# Patient Record
Sex: Male | Born: 1990 | Race: White | Hispanic: No | Marital: Married
Health system: Southern US, Community
[De-identification: ages and names within clinical notes are randomized; demographics above are authoritative.]

---

## 2018-01-09 ENCOUNTER — Encounter (HOSPITAL_COMMUNITY): Payer: Self-pay | Admitting: Radiology

## 2018-01-09 ENCOUNTER — Emergency Department (HOSPITAL_COMMUNITY)
Admission: EM | Admit: 2018-01-09 | Discharge: 2018-01-09 | Disposition: A | Discharge status: Home / Self Care | Payer: BLUE CROSS/BLUE SHIELD | Attending: Emergency Medicine | Admitting: Emergency Medicine

## 2018-01-09 ENCOUNTER — Other Ambulatory Visit: Payer: Self-pay

## 2018-01-09 ENCOUNTER — Emergency Department (HOSPITAL_COMMUNITY): Payer: BLUE CROSS/BLUE SHIELD

## 2018-01-09 DIAGNOSIS — Y999 Unspecified external cause status: Secondary | ICD-10-CM | POA: Insufficient documentation

## 2018-01-09 DIAGNOSIS — Y92410 Unspecified street and highway as the place of occurrence of the external cause: Secondary | ICD-10-CM | POA: Diagnosis not present

## 2018-01-09 DIAGNOSIS — M25562 Pain in left knee: Secondary | ICD-10-CM | POA: Insufficient documentation

## 2018-01-09 DIAGNOSIS — R51 Headache: Secondary | ICD-10-CM | POA: Insufficient documentation

## 2018-01-09 DIAGNOSIS — Z23 Encounter for immunization: Secondary | ICD-10-CM | POA: Diagnosis not present

## 2018-01-09 DIAGNOSIS — S20211A Contusion of right front wall of thorax, initial encounter: Secondary | ICD-10-CM | POA: Diagnosis not present

## 2018-01-09 DIAGNOSIS — S52501A Unspecified fracture of the lower end of right radius, initial encounter for closed fracture: Secondary | ICD-10-CM

## 2018-01-09 DIAGNOSIS — Y939 Activity, unspecified: Secondary | ICD-10-CM | POA: Insufficient documentation

## 2018-01-09 DIAGNOSIS — S20212A Contusion of left front wall of thorax, initial encounter: Secondary | ICD-10-CM | POA: Diagnosis not present

## 2018-01-09 DIAGNOSIS — S161XXA Strain of muscle, fascia and tendon at neck level, initial encounter: Secondary | ICD-10-CM | POA: Insufficient documentation

## 2018-01-09 DIAGNOSIS — S6991XA Unspecified injury of right wrist, hand and finger(s), initial encounter: Secondary | ICD-10-CM | POA: Diagnosis present

## 2018-01-09 LAB — COMPREHENSIVE METABOLIC PANEL
ALT: 61 U/L — ABNORMAL HIGH (ref 0–44)
AST: 89 U/L — AB (ref 15–41)
Albumin: 4.6 g/dL (ref 3.5–5.0)
Alkaline Phosphatase: 54 U/L (ref 38–126)
Anion gap: 12 (ref 5–15)
BUN: 11 mg/dL (ref 6–20)
CHLORIDE: 106 mmol/L (ref 98–111)
CO2: 21 mmol/L — ABNORMAL LOW (ref 22–32)
Calcium: 9.8 mg/dL (ref 8.9–10.3)
Creatinine, Ser: 1 mg/dL (ref 0.61–1.24)
GFR calc Af Amer: >60 mL/min (ref >60)
Glucose, Bld: 115 mg/dL — ABNORMAL HIGH (ref 70–99)
POTASSIUM: 5 mmol/L (ref 3.5–5.1)
Sodium: 139 mmol/L (ref 135–145)
Total Bilirubin: 1.3 mg/dL — ABNORMAL HIGH (ref 0.3–1.2)
Total Protein: 6.8 g/dL (ref 6.5–8.1)

## 2018-01-09 LAB — URINALYSIS, ROUTINE W REFLEX MICROSCOPIC
BILIRUBIN URINE: NEGATIVE
GLUCOSE, UA: NEGATIVE mg/dL
Ketones, ur: NEGATIVE mg/dL
LEUKOCYTES UA: NEGATIVE
Nitrite: NEGATIVE
PH: 5 (ref 5.0–8.0)
PROTEIN: 30 mg/dL — AB
Specific Gravity, Urine: 1.021 (ref 1.005–1.030)

## 2018-01-09 LAB — CBC WITH DIFFERENTIAL/PLATELET
BASOS ABS: 0 10*3/uL (ref 0.0–0.1)
Basophils Relative: 0 %
EOS ABS: 0.1 10*3/uL (ref 0.0–0.7)
Eosinophils Relative: 1 %
HCT: 49.4 % (ref 39.0–52.0)
Hemoglobin: 16.7 g/dL (ref 13.0–17.0)
LYMPHS ABS: 4.6 10*3/uL — AB (ref 0.7–4.0)
Lymphocytes Relative: 33 %
MCH: 29.5 pg (ref 26.0–34.0)
MCHC: 33.8 g/dL (ref 30.0–36.0)
MCV: 87.1 fL (ref 78.0–100.0)
Monocytes Absolute: 0.7 10*3/uL (ref 0.1–1.0)
Monocytes Relative: 5 %
Neutro Abs: 8.6 10*3/uL — ABNORMAL HIGH (ref 1.7–7.7)
Neutrophils Relative %: 61 %
PLATELETS: 188 10*3/uL (ref 150–400)
RBC: 5.67 MIL/uL (ref 4.22–5.81)
RDW: 12.5 % (ref 11.5–15.5)
WBC: 14 10*3/uL — ABNORMAL HIGH (ref 4.0–10.5)

## 2018-01-09 LAB — LIPASE, BLOOD: LIPASE: 33 U/L (ref 11–51)

## 2018-01-09 MED ORDER — SODIUM CHLORIDE 0.9 % IV BOLUS
1000.0000 mL | Freq: Once | INTRAVENOUS | Status: AC
  Administered 2018-01-09: 1000 mL via INTRAVENOUS

## 2018-01-09 MED ORDER — CYCLOBENZAPRINE HCL 10 MG PO TABS: 10.0000 mg | ORAL_TABLET | Freq: Two times a day (BID) | ORAL | 0 refills | Status: AC | PRN

## 2018-01-09 MED ORDER — HYDROCODONE-ACETAMINOPHEN 5-325 MG PO TABS: 1.0000 | ORAL_TABLET | ORAL | 0 refills | Status: AC | PRN

## 2018-01-09 MED ORDER — TETANUS-DIPHTH-ACELL PERTUSSIS 5-2.5-18.5 LF-MCG/0.5 IM SUSP
0.5000 mL | Freq: Once | INTRAMUSCULAR | Status: AC
  Administered 2018-01-09: 0.5 mL via INTRAMUSCULAR
  Filled 2018-01-09: qty 0.5

## 2018-01-09 MED ORDER — IBUPROFEN 600 MG PO TABS: 600.0000 mg | ORAL_TABLET | Freq: Four times a day (QID) | ORAL | 0 refills | Status: AC | PRN

## 2018-01-09 MED ORDER — MORPHINE SULFATE (PF) 4 MG/ML IV SOLN
4.0000 mg | Freq: Once | INTRAVENOUS | Status: AC
  Administered 2018-01-09: 4 mg via INTRAVENOUS
  Filled 2018-01-09: qty 1

## 2018-01-09 MED ORDER — IOHEXOL 300 MG/ML  SOLN
100.0000 mL | Freq: Once | INTRAMUSCULAR | Status: AC | PRN
  Administered 2018-01-09: 100 mL via INTRAVENOUS

## 2018-01-09 MED ORDER — ONDANSETRON HCL 4 MG/2ML IJ SOLN
4.0000 mg | Freq: Once | INTRAMUSCULAR | Status: AC
  Administered 2018-01-09: 4 mg via INTRAVENOUS
  Filled 2018-01-09: qty 2

## 2018-01-09 NOTE — ED Notes (Signed)
Patient transported to X-ray 
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
## 2018-01-09 NOTE — ED Notes (Signed)
Patient transported to CT 

## 2018-01-09 NOTE — ED Notes (Signed)
Ortho tech at bedside 

## 2018-01-09 NOTE — ED Notes (Signed)
Got patient hooked up to the monitor did ekg shown to er doctor patient is resting with call bell in reach 
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
## 2018-01-09 NOTE — ED Notes (Signed)
Pt

## 2018-01-09 NOTE — ED Notes (Signed)
Pt ambulated in hall with no c/o of dizziness or lightheadedness. Pt states only pain is in his left chest/rib area.

## 2018-01-09 NOTE — Progress Notes (Signed)
Orthopedic Tech Progress Note Patient Details:  Eden EmmsMicheal Benett [birth year] [id number]  Ortho Devices Type of Ortho Device: Ace wrap, Volar splint Ortho Device/Splint Location: rue Ortho Device/Splint Interventions: Application   Post Interventions Patient Tolerated: Well Instructions Provided: Care of device   Damyen Knoll 01/09/2018, 9:30 AM

## 2018-01-09 NOTE — ED Notes (Signed)
Patient able to ambulate independently  

## 2018-01-14 ENCOUNTER — Emergency Department (HOSPITAL_COMMUNITY)
Admission: EM | Admit: 2018-01-14 | Discharge: 2018-01-14 | Disposition: A | Discharge status: Home / Self Care | Payer: BLUE CROSS/BLUE SHIELD | Attending: Emergency Medicine | Admitting: Emergency Medicine

## 2018-01-14 ENCOUNTER — Emergency Department (HOSPITAL_COMMUNITY): Payer: BLUE CROSS/BLUE SHIELD

## 2018-01-14 ENCOUNTER — Encounter (HOSPITAL_COMMUNITY): Payer: Self-pay

## 2018-01-14 DIAGNOSIS — R079 Chest pain, unspecified: Secondary | ICD-10-CM | POA: Insufficient documentation

## 2018-01-14 LAB — BASIC METABOLIC PANEL
Anion gap: 8 (ref 5–15)
BUN: 7 mg/dL (ref 6–20)
CO2: 28 mmol/L (ref 22–32)
Calcium: 9.7 mg/dL (ref 8.9–10.3)
Chloride: 104 mmol/L (ref 98–111)
Creatinine, Ser: 0.7 mg/dL (ref 0.61–1.24)
GFR calc Af Amer: >60 mL/min (ref >60)
GFR calc non Af Amer: >60 mL/min (ref >60)
Glucose, Bld: 87 mg/dL (ref 70–99)
Potassium: 4.4 mmol/L (ref 3.5–5.1)
Sodium: 140 mmol/L (ref 135–145)

## 2018-01-14 LAB — I-STAT TROPONIN, ED
Troponin i, poc: 0 ng/mL (ref 0.00–0.08)
Troponin i, poc: 0 ng/mL (ref 0.00–0.08)

## 2018-01-14 LAB — CBC
HCT: 47.2 % (ref 39.0–52.0)
Hemoglobin: 16.4 g/dL (ref 13.0–17.0)
MCH: 29.6 pg (ref 26.0–34.0)
MCHC: 34.7 g/dL (ref 30.0–36.0)
MCV: 85.2 fL (ref 78.0–100.0)
Platelets: 300 10*3/uL (ref 150–400)
RBC: 5.54 MIL/uL (ref 4.22–5.81)
RDW: 12 % (ref 11.5–15.5)
WBC: 10.6 10*3/uL — ABNORMAL HIGH (ref 4.0–10.5)

## 2018-01-14 NOTE — Discharge Instructions (Addendum)
You were seen in the emergency department today for chest pain.  Your work-up was all reassuring.  Your EKG was normal.  The enzyme used to check your heart was normal.  Your chest x-ray was normal and did not show problem with your lungs or with your bones.  All of your lab work looked good.  Please take the medication prescribed at your previous ER visit as prescribed.  Follow-up with your primary care provider within 3 days.  Return to the ER for new or worsening symptoms including but not limited to worsening pain, trouble breathing, fever, coughing up blood, or any other concerns.
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
## 2020-01-18 IMAGING — DX DG CHEST 2V
2 series · 2 of 2 positions shown · non-contrast
Comparison: Chest CT 01/09/2018

CLINICAL DATA: Chest pain

EXAM:
CHEST - 2 VIEW

[chest pa]
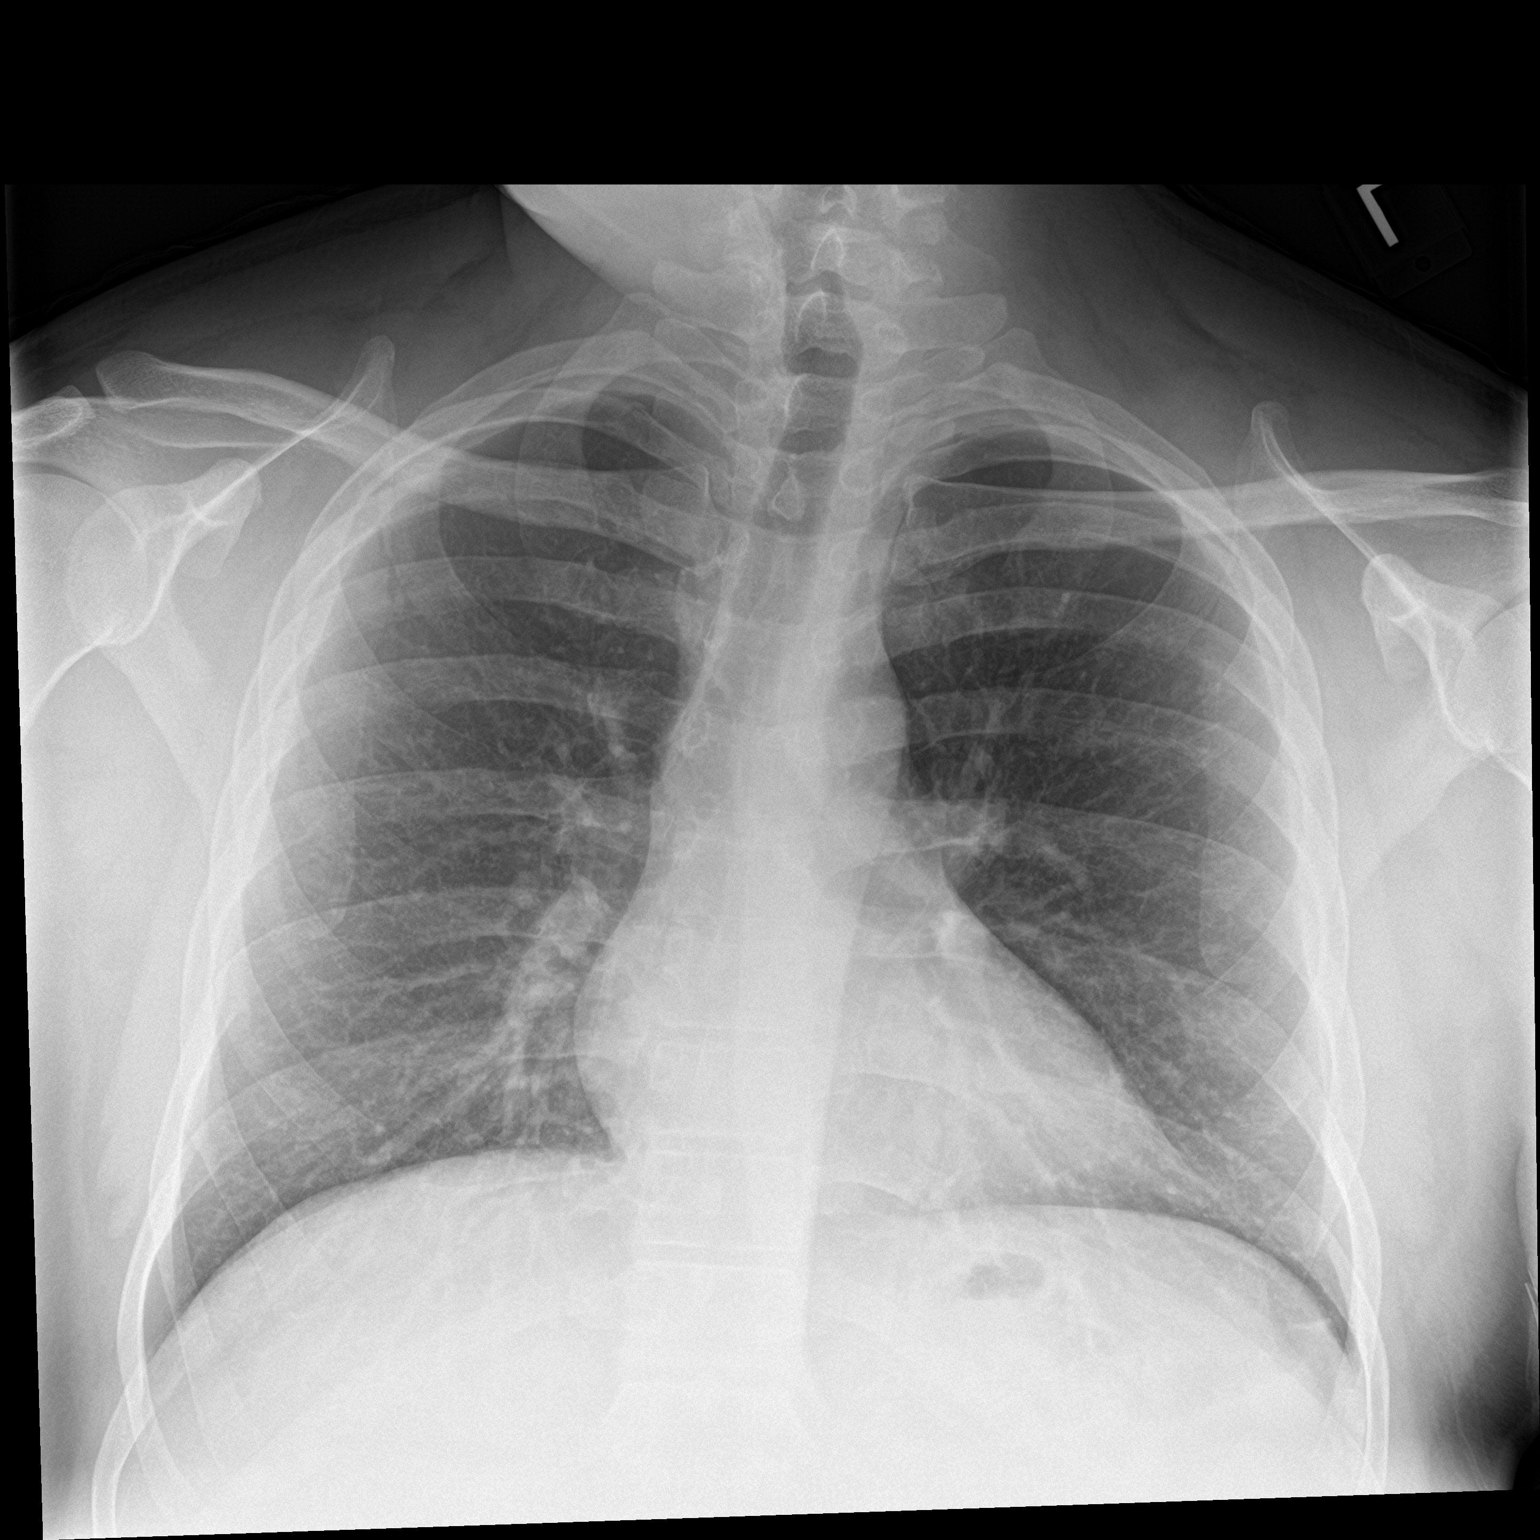

[chest lat]
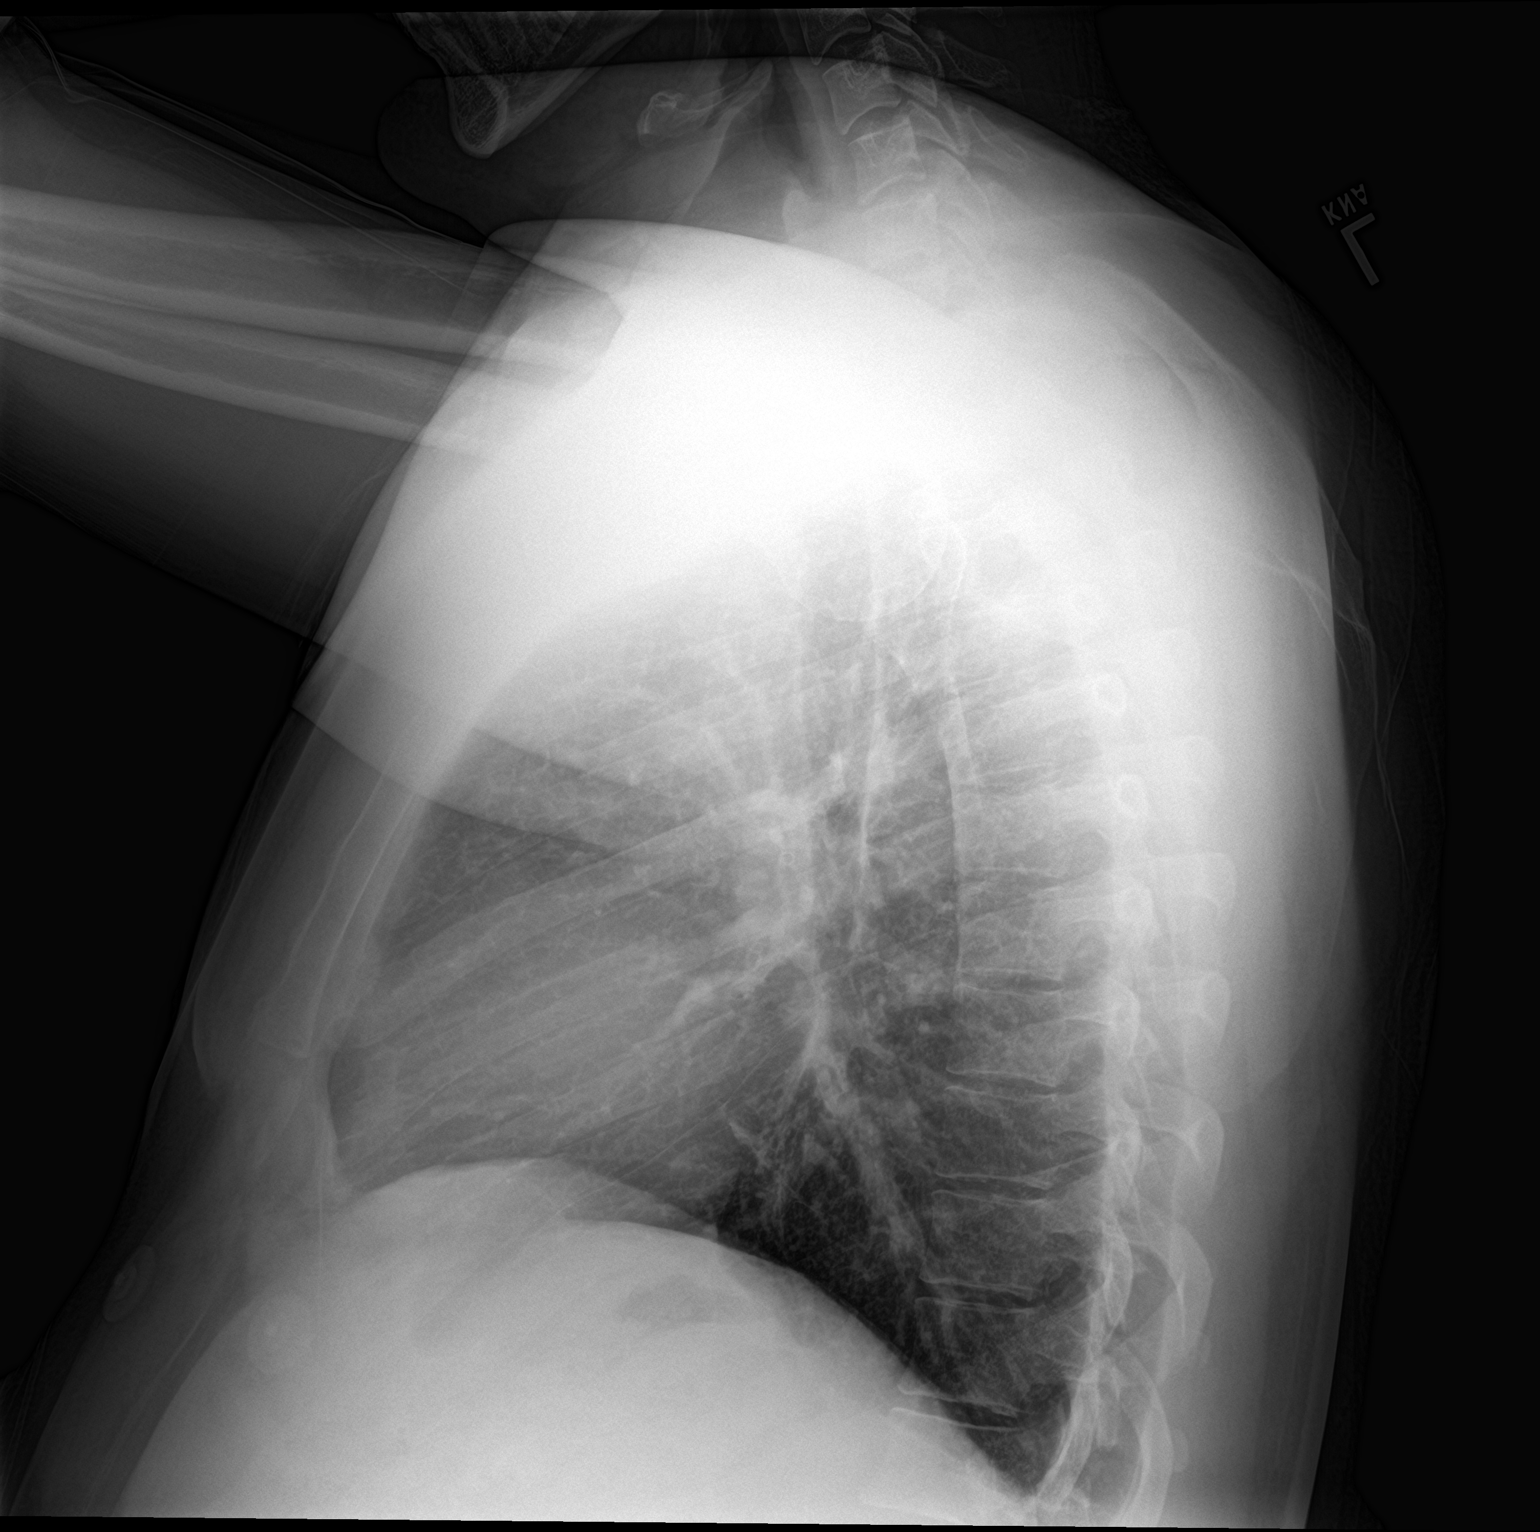

[2 of 2 positions shown; findings below may reference images not displayed]

FINDINGS: Heart and mediastinal contours are within normal limits. No focal
opacities or effusions. No acute bony abnormality.
IMPRESSION: No active cardiopulmonary disease.
